# Patient Record
Sex: Male | Born: 1995 | Race: Black or African American | Hispanic: No | Marital: Single | Smoking: Current every day smoker
Health system: Southern US, Community
[De-identification: ages and names within clinical notes are randomized; demographics above are authoritative.]

## PROBLEM LIST (undated history)

## (undated) DIAGNOSIS — J45909 Unspecified asthma, uncomplicated: Secondary | ICD-10-CM

---
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
## 2015-03-13 ENCOUNTER — Encounter (HOSPITAL_COMMUNITY): Payer: Self-pay | Admitting: *Deleted

## 2015-03-13 ENCOUNTER — Emergency Department (HOSPITAL_COMMUNITY)
Admission: EM | Admit: 2015-03-13 | Discharge: 2015-03-13 | Disposition: A | Discharge status: Home / Self Care | Payer: Self-pay | Attending: Emergency Medicine | Admitting: Emergency Medicine

## 2015-03-13 DIAGNOSIS — Z7982 Long term (current) use of aspirin: Secondary | ICD-10-CM | POA: Insufficient documentation

## 2015-03-13 DIAGNOSIS — R200 Anesthesia of skin: Secondary | ICD-10-CM | POA: Insufficient documentation

## 2015-03-13 DIAGNOSIS — J45909 Unspecified asthma, uncomplicated: Secondary | ICD-10-CM | POA: Insufficient documentation

## 2015-03-13 DIAGNOSIS — F17200 Nicotine dependence, unspecified, uncomplicated: Secondary | ICD-10-CM | POA: Insufficient documentation

## 2015-03-13 DIAGNOSIS — M545 Low back pain, unspecified: Secondary | ICD-10-CM

## 2015-03-13 DIAGNOSIS — G8929 Other chronic pain: Secondary | ICD-10-CM | POA: Insufficient documentation

## 2015-03-13 LAB — URINALYSIS, ROUTINE W REFLEX MICROSCOPIC
Bilirubin Urine: NEGATIVE
Glucose, UA: NEGATIVE mg/dL
Hgb urine dipstick: NEGATIVE
KETONES UR: NEGATIVE mg/dL
LEUKOCYTES UA: NEGATIVE
NITRITE: NEGATIVE
Protein, ur: NEGATIVE mg/dL
Specific Gravity, Urine: 1.019 (ref 1.005–1.030)
pH: 8.5 — ABNORMAL HIGH (ref 5.0–8.0)

## 2015-03-13 MED ORDER — METHOCARBAMOL 500 MG PO TABS: 500.0000 mg | ORAL_TABLET | Freq: Two times a day (BID) | ORAL | Status: DC

## 2015-03-13 MED ORDER — NAPROXEN 500 MG PO TABS: 500.0000 mg | ORAL_TABLET | Freq: Two times a day (BID) | ORAL | Status: DC

## 2015-03-13 NOTE — ED Provider Notes (Signed)
CSN: [id number]     Arrival date & time 03/13/15  2223 History  By signing my name below, I, Marisue Humble, attest that this documentation has been prepared under the direction and in the presence of non-physician practitioner, Santiago Glad, PA-C. Electronically Signed: Marisue Humble, Scribe. 11:29 PM.     Chief Complaint  Patient presents with  . Back Pain   The history is provided by the patient. No language interpreter was used.   HPI Comments:  Joseph Rosario is a 20 y.o. male with a PMHx of chronic back pain who presents to the Emergency Department complaining of moderate, intermittent, right lower back pain onset one week ago. Pt reports pain is worse upon waking and when he lays down. He also reports hitting his left foot a few days before onset of back pain. No pain in his left foot at this time.  Pt reports associated numbness/tingling in left leg.  He usually takes Tramadol with mild relief of back pain and notes regular heavy lifting at work. Pt expresses concern for possible kidney stone. Pt denies any recent injury, incontinence, fever, chills, nausea, vomiting, or urinary symptoms.  No Hx of cancer or IV drug use.  Past Medical History  Diagnosis Date  . Asthma    History reviewed. No pertinent past surgical history. No family history on file. Social History  Substance Use Topics  . Smoking status: Current Every Day Smoker -- 0.50 packs/day  . Smokeless tobacco: None  . Alcohol Use: Yes     Comment: occasional    Review of Systems  Constitutional: Negative for fever and chills.  Gastrointestinal: Negative for nausea and vomiting.  Genitourinary: Negative for enuresis.  Musculoskeletal: Positive for back pain.  Neurological: Positive for numbness.  All other systems reviewed and are negative.   Allergies  Review of patient's allergies indicates no known allergies.  Home Medications   Prior to Admission medications   Medication Sig Start Date End Date  Taking? Authorizing Provider  aspirin 325 MG tablet Take 650 mg by mouth daily as needed for headache.   Yes Historical Provider, MD  ibuprofen (ADVIL,MOTRIN) 200 MG tablet Take 400 mg by mouth every 6 (six) hours as needed for moderate pain.   Yes Historical Provider, MD  Pseudoeph-Doxylamine-DM-APAP (NYQUIL PO) Take 1 capsule by mouth at bedtime as needed (cold symptoms).   Yes Historical Provider, MD  traMADol (ULTRAM) 50 MG tablet Take 50 mg by mouth every 6 (six) hours as needed.   Yes Historical Provider, MD   BP 153/92 mmHg  Pulse 74  Temp(Src) 98.6 F (37 C) (Oral)  Resp 18  Wt 125 lb 5 oz (56.841 kg)  SpO2 100% Physical Exam  Constitutional: He appears well-developed and well-nourished. No distress.  HENT:  Head: Normocephalic and atraumatic.  Eyes: Right eye exhibits no discharge. Left eye exhibits no discharge.  Neck: Normal range of motion. Neck supple.  Cardiovascular: Normal rate, regular rhythm and normal heart sounds.   Pulmonary/Chest: Effort normal and breath sounds normal. No respiratory distress.  Musculoskeletal:  Mild TTP over lumbar spine. No step offs or deformities.  Neurological: He is alert. He has normal strength. No sensory deficit. Coordination normal.  Reflex Scores:      Patellar reflexes are 2+ on the right side and 2+ on the left side. Distal sensation of both feet intact. 5/5 muscle strength in BLE.  Skin: No rash noted. He is not diaphoretic.  Psychiatric: He has a normal mood and affect.  His behavior is normal.  Nursing note and vitals reviewed.  ED Course  Procedures  DIAGNOSTIC STUDIES: Oxygen Saturation is 100% on RA, normal by my interpretation.    COORDINATION OF CARE: 11:24 PM Will prescribe a muscle relaxer. Discussed treatment plan with pt at bedside and pt agreed to plan.  Labs Review Labs Reviewed  URINALYSIS, ROUTINE W REFLEX MICROSCOPIC (NOT AT [facility]) - Abnormal; Notable for the following:    APPearance CLOUDY (*)    pH 8.5  (*)    All other components within normal limits   Imaging Review No results found. I have personally reviewed and evaluated these lab results as part of my medical decision-making.   EKG Interpretation None      MDM   Final diagnoses:  Right-sided low back pain without sciatica  Patient with back pain.  No neurological deficits and normal neuro exam.  Patient ambulating without difficulty.  No loss of bowel or bladder control.  No concern for cauda equina.  No fever, night sweats, weight loss, h/o cancer, IVDU.  UA ordered in Triage, negative for hemoglobin or infection.  Patient stable for discharge.  Return precautions given.  I personally performed the services described in this documentation, which was scribed in my presence. The recorded information has been reviewed and is accurate.    Santiago Glad, PA-C 03/14/15 0102  Gilda Crease, MD 03/14/15 [phone]

## 2015-03-13 NOTE — Discharge Instructions (Signed)
Do not drive or operate heavy machinery for 4 hours after taking muscle relaxer.

## 2015-03-13 NOTE — ED Notes (Signed)
Pt c/o back pain x 1.5 years. States his pain comes and goes. States when he takes tramadol it helps a little. Also has tried a muscle relaxer which makes him sleep. Pt wondering if he has kidney stone.

## 2015-03-13 NOTE — ED Notes (Signed)
See PA note for secondary assessment.   

## 2015-03-13 NOTE — ED Notes (Signed)
Pt states pain is in his lower back, radiates to the right.
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
## 2017-06-29 ENCOUNTER — Other Ambulatory Visit: Payer: Self-pay

## 2017-06-29 ENCOUNTER — Emergency Department (HOSPITAL_COMMUNITY)
Admission: EM | Admit: 2017-06-29 | Discharge: 2017-06-29 | Disposition: A | Discharge status: Home / Self Care | Payer: Self-pay | Attending: Emergency Medicine | Admitting: Emergency Medicine

## 2017-06-29 ENCOUNTER — Encounter (HOSPITAL_COMMUNITY): Payer: Self-pay | Admitting: Emergency Medicine

## 2017-06-29 DIAGNOSIS — J45909 Unspecified asthma, uncomplicated: Secondary | ICD-10-CM | POA: Insufficient documentation

## 2017-06-29 DIAGNOSIS — F1210 Cannabis abuse, uncomplicated: Secondary | ICD-10-CM | POA: Insufficient documentation

## 2017-06-29 DIAGNOSIS — F17210 Nicotine dependence, cigarettes, uncomplicated: Secondary | ICD-10-CM | POA: Insufficient documentation

## 2017-06-29 DIAGNOSIS — N481 Balanitis: Secondary | ICD-10-CM | POA: Insufficient documentation

## 2017-06-29 MED ORDER — FLUCONAZOLE 100 MG PO TABS: 100.0000 mg | ORAL_TABLET | Freq: Every day | ORAL | 0 refills | Status: AC

## 2017-06-29 MED ORDER — CLOTRIMAZOLE 1 % EX CREA: TOPICAL_CREAM | CUTANEOUS | 0 refills | Status: DC

## 2017-06-29 MED ORDER — FLUCONAZOLE 100 MG PO TABS: 100.0000 mg | ORAL_TABLET | Freq: Every day | ORAL | Status: DC

## 2017-06-29 NOTE — Discharge Instructions (Signed)
Please apply the topical Clotrimazole cream twice a day.  Avoid sex or masturbation for the next 2 weeks, Diflucan once a day for 5 days  We will call you if your urine test was positive for an STD.

## 2017-06-29 NOTE — ED Triage Notes (Signed)
Pt reports penile pain and bleeding due to rough intercourse x 3 weeks. Bleeding began yesterday. Denies any itching or discharge. States he is uncircumcised.

## 2017-06-29 NOTE — ED Provider Notes (Signed)
[facility] EMERGENCY DEPARTMENT Provider Note   CSN: [id number] Arrival date & time: 06/29/17  0848     History   Chief Complaint Chief Complaint  Patient presents with  . Penis Injury    HPI Joseph Rosario is a 22 y.o. male.  HPI  The patient is a 22 year old male, denies a history of diabetes, states that for the last 2 weeks he has had some pain and discomfort around the head of the penis at the base of the foreskin.  He is able to retract without difficulty.  He has had no difficulty with urination, no burning but has had a small amount of discharge buildup underneath the foreskin.  He reports that the skin underneath there is red, but he does not have any discharge from the penis and denies any risk for STDs.  He is using a condom for protection however he states that the pain is increased during sex.  He denies any current abdominal pain and has had no fevers or chills nausea or vomiting.  Past Medical History:  Diagnosis Date  . Asthma     There are no active problems to display for this patient.   History reviewed. No pertinent surgical history.      Home Medications    Prior to Admission medications   Medication Sig Start Date End Date Taking? Authorizing Provider  aspirin 325 MG tablet Take 650 mg by mouth daily as needed for headache.    [provider]  clotrimazole (LOTRIMIN) 1 % cream Apply to affected area 2 times daily 06/29/17   Eber Hong, MD  ibuprofen (ADVIL,MOTRIN) 200 MG tablet Take 400 mg by mouth every 6 (six) hours as needed for moderate pain.    [provider]  methocarbamol (ROBAXIN) 500 MG tablet Take 1 tablet (500 mg total) by mouth 2 (two) times daily. 03/13/15   Santiago Glad, PA-C  naproxen (NAPROSYN) 500 MG tablet Take 1 tablet (500 mg total) by mouth 2 (two) times daily. 03/13/15   Santiago Glad, PA-C  Pseudoeph-Doxylamine-DM-APAP (NYQUIL PO) Take 1 capsule by mouth at bedtime as needed (cold symptoms).    [provider]  traMADol (ULTRAM) 50 MG tablet Take 50 mg by mouth every 6 (six) hours as needed.    [provider]    Family History No family history on file.  Social History Social History   Tobacco Use  . Smoking status: Current Every Day Smoker    Packs/day: 1.00  . Smokeless tobacco: Never Used  Substance Use Topics  . Alcohol use: Yes    Comment: occasional  . Drug use: Yes    Types: Marijuana    Comment: last use 2-3 days ago     Allergies   Patient has no known allergies.   Review of Systems Review of Systems  Constitutional: Negative for fever.  Gastrointestinal: Negative for abdominal pain and vomiting.  Genitourinary: Positive for genital sores and penile pain. Negative for difficulty urinating, discharge, dysuria, hematuria, penile swelling and scrotal swelling.     Physical Exam Updated Vital Signs BP (!) 150/100 (BP Location: Left Arm)   Pulse (!) 102   Temp 98.2 F (36.8 C) (Oral)   Resp 16   Ht  (1.651 m)   Wt 68.9 kg (152 lb)   SpO2 100%   BMI 25.29 kg/m   Physical Exam  Constitutional: He appears well-developed and well-nourished.  HENT:  Head: Normocephalic and atraumatic.  Eyes: Conjunctivae are normal. Right  eye exhibits no discharge. Left eye exhibits no discharge.  Pulmonary/Chest: Effort normal. No respiratory distress.  Genitourinary:  Genitourinary Comments: Normal-appearing penis scrotum and testicles with a normal retractable foreskin, there is some erythema at the base of the foreskin against the head of the penis.  There is no discharge from the urethral meatus.  There is no open sores wounds or other rashes  Neurological: He is alert. Coordination normal.  Skin: Skin is warm and dry. No rash noted. He is not diaphoretic. No erythema.  Psychiatric: He has a normal mood and affect.  Nursing note and vitals reviewed.    ED Treatments / Results  Labs (all labs ordered are listed, but only abnormal results are  displayed) Labs Reviewed  GC/CHLAMYDIA PROBE AMP (Jessie) NOT AT [facility]    EKG None  Radiology No results found.  Procedures Procedures (including critical care time)  Medications Ordered in ED Medications  fluconazole (DIFLUCAN) tablet 100 mg (has no administration in time range)     Initial Impression / Assessment and Plan / ED Course  I have reviewed the triage vital signs and the nursing notes.  Pertinent labs & imaging results that were available during my care of the patient were reviewed by me and considered in my medical decision making (see chart for details).    The patient has been placing Aquaphor on this area because of the discomfort.  He may have a candidal infection in there, will treat with topical ointment as well as fluconazole.  Will test for STDs by urine though this appears to be more of a foreskin retraction issue than it does to be an sexually transmitted disease.  The patient is agreeable to the plan.  Final Clinical Impressions(s) / ED Diagnoses   Final diagnoses:  Balanitis    ED Discharge Orders        Ordered    clotrimazole (LOTRIMIN) 1 % cream     06/29/17 0902       Eber Hong, MD 06/29/17 [phone]

## 2017-07-02 LAB — GC/CHLAMYDIA PROBE AMP (~~LOC~~) NOT AT ARMC
Chlamydia: NEGATIVE
Neisseria Gonorrhea: NEGATIVE

## 2022-03-20 ENCOUNTER — Emergency Department (HOSPITAL_COMMUNITY)
Admission: EM | Admit: 2022-03-20 | Discharge: 2022-03-20 | Disposition: A | Discharge status: Home / Self Care | Payer: Self-pay | Attending: Emergency Medicine | Admitting: Emergency Medicine

## 2022-03-20 ENCOUNTER — Other Ambulatory Visit: Payer: Self-pay

## 2022-03-20 ENCOUNTER — Encounter (HOSPITAL_COMMUNITY): Payer: Self-pay | Admitting: *Deleted

## 2022-03-20 DIAGNOSIS — L0201 Cutaneous abscess of face: Secondary | ICD-10-CM | POA: Insufficient documentation

## 2022-03-20 DIAGNOSIS — L03211 Cellulitis of face: Secondary | ICD-10-CM | POA: Insufficient documentation

## 2022-03-20 MED ORDER — SULFAMETHOXAZOLE-TRIMETHOPRIM 800-160 MG PO TABS: 1.0000 | ORAL_TABLET | Freq: Two times a day (BID) | ORAL | 0 refills | Status: AC

## 2022-03-20 NOTE — ED Triage Notes (Signed)
Pt has swelling and redness to area surrounding right eye.  This began as a "boil" on right eyebrown thursday but swelling increased each day.  Pt has puffyness in his eyelids and surrounding area, vision is not affected.  No fever or chills.

## 2022-03-20 NOTE — ED Provider Notes (Signed)
[facility] Provider Note   CSN: [id number] Arrival date & time: 03/20/22  1526     History  Chief Complaint  Patient presents with   Recurrent Skin Infections    Joseph Rosario is a 27 y.o. male with history of asthma who presents the emergency department complaining of a boil in his face with associated swelling.  This started about 3 days ago.  He reports a history of prior.  He wanted to come be evaluated because he was starting to have some swelling of his right eyelid.  Used warm compress last night and felt the area come to ahead.  Denies any changes to his vision.  No fevers or chills.  HPI     Home Medications Prior to Admission medications   Medication Sig Start Date End Date Taking? Authorizing Provider  sulfamethoxazole-trimethoprim (BACTRIM DS) 800-160 MG tablet Take 1 tablet by mouth 2 (two) times daily for 7 days. 03/20/22 03/27/22 Yes Shadi Larner T, PA-C  aspirin 325 MG tablet Take 650 mg by mouth daily as needed for headache.    [provider]  clotrimazole (LOTRIMIN) 1 % cream Apply to affected area 2 times daily 06/29/17   Noemi Chapel, MD  ibuprofen (ADVIL,MOTRIN) 200 MG tablet Take 400 mg by mouth every 6 (six) hours as needed for moderate pain.    [provider]  methocarbamol (ROBAXIN) 500 MG tablet Take 1 tablet (500 mg total) by mouth 2 (two) times daily. 03/13/15   Hyman Bible, PA-C  naproxen (NAPROSYN) 500 MG tablet Take 1 tablet (500 mg total) by mouth 2 (two) times daily. 03/13/15   Hyman Bible, PA-C  Pseudoeph-Doxylamine-DM-APAP (NYQUIL PO) Take 1 capsule by mouth at bedtime as needed (cold symptoms).    [provider]  traMADol (ULTRAM) 50 MG tablet Take 50 mg by mouth every 6 (six) hours as needed.    [provider]      Allergies    Patient has no known allergies.    Review of Systems   Review of Systems  HENT:  Positive for facial swelling.   Skin:         abscess  All other systems reviewed and are negative.   Physical Exam Updated Vital Signs There were no vitals taken for this visit. Physical Exam Vitals and nursing note reviewed.  Constitutional:      Appearance: Normal appearance.  HENT:     Head: Normocephalic and atraumatic.      Comments: Area of erythema and fluctuance, about 1.5 cm in diameter. Mild surrounding cellulitis Eyes:     Extraocular Movements: Extraocular movements intact.     Conjunctiva/sclera: Conjunctivae normal.     Comments: Right upper and lower eyelid mildly swollen. No tenderness or crepitus palpated.   Pulmonary:     Effort: Pulmonary effort is normal. No respiratory distress.  Skin:    General: Skin is warm and dry.  Neurological:     Mental Status: He is alert.  Psychiatric:        Mood and Affect: Mood normal.        Behavior: Behavior normal.     ED Results / Procedures / Treatments   Labs (all labs ordered are listed, but only abnormal results are displayed) Labs Reviewed - No data to display  EKG None  Radiology No results found.  Procedures Procedures    Medications Ordered in ED Medications - No data to display  ED Course/ Medical  Decision Making/ A&P                             Medical Decision Making Risk Prescription drug management.  This patient is a 27 y.o. male who presents to the ED for concern of facial abscess and swelling.   Differential diagnoses prior to evaluation: Abscess, cellulitis, orbital cellulitis, preseptal cellulitis  Past Medical History / Social History / Additional history: Chart reviewed. Pertinent results include: asthma  Physical Exam: Physical exam performed. The pertinent findings include: abscess about 1.5 cm in diameter just lateral of the right eyebrow with mild surrounding cellulitis, some swelling of the right eyelids without affecting vision  Medications / Treatment: Abscess is very superficial and appears to be ready to  open on its own, thus I do not feel it is requiring incision and drainage today   Disposition: After consideration of the diagnostic results and the patients response to treatment, I feel that emergency department workup does not suggest an emergent condition requiring admission or immediate intervention beyond what has been performed at this time. The plan is: discharge to home with antibiotics and recommendations for warm compresses and gentle pressure to assist in opening the abscess. Not needing I&D today. Low concern for orbital cellulitis and patient has no change in vision and no pain with EOMs. The patient is safe for discharge and has been instructed to return immediately for worsening symptoms, change in symptoms or any other concerns.  Final Clinical Impression(s) / ED Diagnoses Final diagnoses:  Facial cellulitis  Facial abscess    Rx / DC Orders ED Discharge Orders          Ordered    sulfamethoxazole-trimethoprim (BACTRIM DS) 800-160 MG tablet  2 times daily        03/20/22 1710           Portions of this report may have been transcribed using voice recognition software. Every effort was made to ensure accuracy; however, inadvertent computerized transcription errors may be present.    Estill Cotta 03/20/22 1716    Noemi Chapel, MD 03/21/22 [phone]

## 2022-03-20 NOTE — Discharge Instructions (Signed)
You are seen in the ER for skin infection.  As we discussed the boil next your eyebrow is called an abscess.  I think that this area wants to open up on its own.  I recommend taking ibuprofen and/or Tylenol as needed for pain, and also will help with the swelling.  If you can find any numbing cream or jelly at the pharmacy you can put this over the area for about 10 to 15 minutes.  At that point I would use a warm compress and apply very gentle pressure.  I think that you will find this will open on its own and will require using a needle or scalpel.  I am placing you on antibiotics to help treat the surrounding skin infection as well.  Continue monitor how you are doing and return to the ER for any new or worsening symptoms such as fever, or any changes in your vision.

## 2023-02-22 ENCOUNTER — Encounter (HOSPITAL_COMMUNITY): Payer: Self-pay

## 2023-02-22 ENCOUNTER — Other Ambulatory Visit: Payer: Self-pay

## 2023-02-22 ENCOUNTER — Emergency Department (HOSPITAL_COMMUNITY)
Admission: EM | Admit: 2023-02-22 | Discharge: 2023-02-22 | Disposition: A | Discharge status: Home / Self Care | Payer: Self-pay | Attending: Emergency Medicine | Admitting: Emergency Medicine

## 2023-02-22 DIAGNOSIS — K047 Periapical abscess without sinus: Secondary | ICD-10-CM

## 2023-02-22 DIAGNOSIS — Z7982 Long term (current) use of aspirin: Secondary | ICD-10-CM | POA: Insufficient documentation

## 2023-02-22 MED ORDER — AMOXICILLIN 250 MG PO CAPS
500.0000 mg | ORAL_CAPSULE | Freq: Once | ORAL | Status: AC
  Administered 2023-02-22: 500 mg via ORAL
  Filled 2023-02-22: qty 2

## 2023-02-22 MED ORDER — AMOXICILLIN 500 MG PO CAPS: 500.0000 mg | ORAL_CAPSULE | Freq: Three times a day (TID) | ORAL | 0 refills | Status: DC

## 2023-02-22 NOTE — ED Provider Notes (Signed)
[name] Provider Note   CSN: [id number] Arrival date & time: 02/22/23  1253     History  Chief Complaint  Patient presents with   Dental Pain    Joseph Rosario is a 28 y.o. male presenting with pain and swelling of his gingiva medial to his right upper third molar tooth.  He has had no specific chronic pain from his wisdom teeth but was advised he will probably need to have them removed, developed a "hole" in his gingiva which he first noticed yesterday.  He is unsure if there is been drainage from the site.  He does endorse right sided throat pain described as soreness with swallowing, he has had no fevers or chills, difficulty swallowing or other complaints.  He has used both hydrogen peroxide and salt water swish and spit solutions to the site.  He is found no alleviators for symptoms.  He does have a Education officer, community and Joseph Rosario but has been sometime since he has seen this provider.  The history is provided by the patient.       Home Medications Prior to Admission medications   Medication Sig Start Date End Date Taking? Authorizing Provider  amoxicillin (AMOXIL) 500 MG capsule Take 1 capsule (500 mg total) by mouth 3 (three) times daily. 02/22/23  Yes Jennika Ringgold, Raynelle Fanning, PA-C  aspirin 325 MG tablet Take 650 mg by mouth daily as needed for headache.    [provider]  clotrimazole (LOTRIMIN) 1 % cream Apply to affected area 2 times daily 06/29/17   Eber Hong, MD  ibuprofen (ADVIL,MOTRIN) 200 MG tablet Take 400 mg by mouth every 6 (six) hours as needed for moderate pain.    [provider]  methocarbamol (ROBAXIN) 500 MG tablet Take 1 tablet (500 mg total) by mouth 2 (two) times daily. 03/13/15   Santiago Glad, PA-C  naproxen (NAPROSYN) 500 MG tablet Take 1 tablet (500 mg total) by mouth 2 (two) times daily. 03/13/15   Santiago Glad, PA-C  Pseudoeph-Doxylamine-DM-APAP (NYQUIL PO) Take 1 capsule by mouth at bedtime as needed  (cold symptoms).    [provider]  traMADol (ULTRAM) 50 MG tablet Take 50 mg by mouth every 6 (six) hours as needed.    [provider]      Allergies    Patient has no known allergies.    Review of Systems   Review of Systems  Constitutional:  Negative for chills and fever.  HENT:  Positive for dental problem and sore throat. Negative for facial swelling.   Respiratory:  Negative for shortness of breath.   Musculoskeletal:  Negative for neck pain and neck stiffness.    Physical Exam Updated Vital Signs BP (!) 156/98 (BP Location: Right Arm)   Pulse 73   Temp 99.2 F (37.3 C) (Oral)   Resp 14   Ht 5\' 6"  (1.676 m)   Wt 72.6 kg   SpO2 100%   BMI 25.82 kg/m  Physical Exam Constitutional:      General: He is not in acute distress.    Appearance: He is well-developed.  HENT:     Head: Normocephalic and atraumatic.     Jaw: No trismus.     Right Ear: Tympanic membrane and external ear normal.     Left Ear: Tympanic membrane and external ear normal.     Mouth/Throat:     Mouth: Mucous membranes are moist. No oral lesions.     Dentition: No dental abscesses.  Pharynx: Oropharynx is clear. No oropharyngeal exudate or posterior oropharyngeal erythema.     Comments: There is a small laceration of his gingiva medial to his right upper third molar tooth.  There is tenderness palpation at the site, there is no obvious purulent drainage from the site.  There is no trismus, sublingual space is soft.  No tonsillar adenopathy. Eyes:     Conjunctiva/sclera: Conjunctivae normal.  Cardiovascular:     Rate and Rhythm: Normal rate.  Musculoskeletal:        General: Normal range of motion.     Cervical back: Normal range of motion and neck supple.  Lymphadenopathy:     Cervical: No cervical adenopathy.  Skin:    General: Skin is warm and dry.     Findings: No erythema.  Neurological:     Mental Status: He is alert and oriented to person, place, and time.      ED Results / Procedures / Treatments   Labs (all labs ordered are listed, but only abnormal results are displayed) Labs Reviewed - No data to display  EKG None  Radiology No results found.  Procedures Procedures    Medications Ordered in ED Medications  amoxicillin (AMOXIL) capsule 500 mg (500 mg Oral Given 02/22/23 1642)    ED Course/ Medical Decision Making/ A&P                                 Medical Decision Making Patient with gingival edema and a small punctum next to the right third molar tooth suggesting possible tracking from the root of this tooth.  There is no significant facial edema or erythema, no trismus and no sublingual induration suggesting Ludwig's angina.  Oropharynx is clear.  He is placed on amoxicillin for treatment of dental infection.  Advised close follow-up with his dentist, he will call on Monday to arrange an appointment.  Risk Prescription drug management.           Final Clinical Impression(s) / ED Diagnoses Final diagnoses:  Dental infection  Dental abscess    Rx / DC Orders ED Discharge Orders          Ordered    amoxicillin (AMOXIL) 500 MG capsule  3 times daily        02/22/23 1633              Victoriano Lain 02/22/23 2002    Terrilee Files, MD 02/23/23 1047

## 2023-02-22 NOTE — Discharge Instructions (Signed)
Complete your entire course of antibiotics as prescribed.    Avoid applying heat or ice to this abscess area which can worsen your symptoms.  You may use warm salt water swish and spit treatment or half peroxide and water swish and spit after meals to keep this area clean as discussed.  Call the dentist listed above for further management of your symptoms.  

## 2023-02-22 NOTE — ED Triage Notes (Signed)
Pt stated that he believes he has an abscess behind his right upper wisdom tooth. Pt says he can see a hole and he has pain in his throat and back teeth on the right side.

## 2023-04-03 ENCOUNTER — Telehealth: Payer: Self-pay | Admitting: Physician Assistant

## 2023-04-03 DIAGNOSIS — J020 Streptococcal pharyngitis: Secondary | ICD-10-CM

## 2023-04-03 MED ORDER — AMOXICILLIN 875 MG PO TABS: 875.0000 mg | ORAL_TABLET | Freq: Two times a day (BID) | ORAL | 0 refills | Status: AC

## 2023-04-03 NOTE — Progress Notes (Signed)
 Virtual Visit Consent   [name], you are scheduled for a virtual visit with a [facility] provider today. Just as with appointments in the office, your consent must be obtained to participate. Your consent will be active for this visit and any virtual visit you may have with one of our providers in the next 365 days. If you have a MyChart account, a copy of this consent can be sent to you electronically.  As this is a virtual visit, video technology does not allow for your provider to perform a traditional examination. This may limit your provider's ability to fully assess your condition. If your provider identifies any concerns that need to be evaluated in person or the need to arrange testing (such as labs, EKG, etc.), we will make arrangements to do so. Although advances in technology are sophisticated, we cannot ensure that it will always work on either your end or our end. If the connection with a video visit is poor, the visit may have to be switched to a telephone visit. With either a video or telephone visit, we are not always able to ensure that we have a secure connection.  By engaging in this virtual visit, you consent to the provision of healthcare and authorize for your insurance to be billed (if applicable) for the services provided during this visit. Depending on your insurance coverage, you may receive a charge related to this service.  I need to obtain your verbal consent now. Are you willing to proceed with your visit today? TRAXTON KOLENDA has provided verbal consent on 04/03/2023 for a virtual visit (video or telephone). Margaretann Loveless, PA-C  Date: 04/03/2023 2:37 PM   Virtual Visit via Video Note   I, Margaretann Loveless, connected with  Joseph Rosario  ([id number], Sep 12, 1995) on 04/03/23 at  2:30 PM EST by a video-enabled telemedicine application and verified that I am speaking with the correct person using two identifiers.  Location: Patient: Virtual Visit Location  Patient: Home Provider: Virtual Visit Location Provider: Home Office   I discussed the limitations of evaluation and management by telemedicine and the availability of in person appointments. The patient expressed understanding and agreed to proceed.    History of Present Illness: Joseph Rosario is a 28 y.o. who identifies as a male who was assigned male at birth, and is being seen today for sore throat, congestion, chest pain.  HPI: Sore Throat  This is a new problem. The current episode started in the past 7 days. The problem has been gradually worsening. There has been no fever (feels hot). The pain is mild. Associated symptoms include congestion, coughing, diarrhea, headaches, swollen glands and trouble swallowing. Pertinent negatives include no ear discharge, ear pain, hoarse voice, plugged ear sensation, shortness of breath or vomiting. He has had exposure to strep. Exposure to: son. He has tried acetaminophen (warm tea with honey and lemon) for the symptoms. The treatment provided no relief.    Had intermittent chest pain Sunday and Monday noted on the right side and most noticeable with bending forward, now resolved completely.  Problems: There are no active problems to display for this patient.   Allergies: No Known Allergies Medications:  Current Outpatient Medications:    amoxicillin (AMOXIL) 875 MG tablet, Take 1 tablet (875 mg total) by mouth 2 (two) times daily for 10 days., Disp: 20 tablet, Rfl: 0   aspirin 325 MG tablet, Take 650 mg by mouth daily as needed for headache., Disp: ,  Rfl:    clotrimazole (LOTRIMIN) 1 % cream, Apply to affected area 2 times daily, Disp: 15 g, Rfl: 0   ibuprofen (ADVIL,MOTRIN) 200 MG tablet, Take 400 mg by mouth every 6 (six) hours as needed for moderate pain., Disp: , Rfl:    methocarbamol (ROBAXIN) 500 MG tablet, Take 1 tablet (500 mg total) by mouth 2 (two) times daily., Disp: 20 tablet, Rfl: 0   naproxen (NAPROSYN) 500 MG tablet, Take 1 tablet  (500 mg total) by mouth 2 (two) times daily., Disp: 30 tablet, Rfl: 0   Pseudoeph-Doxylamine-DM-APAP (NYQUIL PO), Take 1 capsule by mouth at bedtime as needed (cold symptoms)., Disp: , Rfl:    traMADol (ULTRAM) 50 MG tablet, Take 50 mg by mouth every 6 (six) hours as needed., Disp: , Rfl:   Observations/Objective: Patient is well-developed, well-nourished in no acute distress.  Resting comfortably at home.  Head is normocephalic, atraumatic.  No labored breathing.  Speech is clear and coherent with logical content.  Patient is alert and oriented at baseline.    Assessment and Plan: 1. Strep throat (Primary) - amoxicillin (AMOXIL) 875 MG tablet; Take 1 tablet (875 mg total) by mouth 2 (two) times daily for 10 days.  Dispense: 20 tablet; Refill: 0  - Suspect strep throat - Amoxicillin prescribed - Tylenol and Ibuprofen alternating every 4 hours - Salt water gargles - Chloraseptic spray - Liquid and soft food diet - Push fluids - New toothbrush in 3 days - Seek in person evaluation if not improving or if symptoms worsen   Follow Up Instructions: I discussed the assessment and treatment plan with the patient. The patient was provided an opportunity to ask questions and all were answered. The patient agreed with the plan and demonstrated an understanding of the instructions.  A copy of instructions were sent to the patient via MyChart unless otherwise noted below.    The patient was advised to call back or seek an in-person evaluation if the symptoms worsen or if the condition fails to improve as anticipated.    Margaretann Loveless, PA-C

## 2023-04-03 NOTE — Patient Instructions (Signed)
 Joseph Rosario, thank you for joining Margaretann Loveless, PA-C for today's virtual visit.  While this provider is not your primary care provider (PCP), if your PCP is located in our provider database this encounter information will be shared with them immediately following your visit.   A [facility] MyChart account gives you access to today's visit and all your visits, tests, and labs performed at [facility] " click here if you don't have a [facility] MyChart account or go to [email or web address]  Consent: (Patient) Joseph Rosario provided verbal consent for this virtual visit at the beginning of the encounter.  Current Medications:  Current Outpatient Medications:    amoxicillin (AMOXIL) 875 MG tablet, Take 1 tablet (875 mg total) by mouth 2 (two) times daily for 10 days., Disp: 20 tablet, Rfl: 0   aspirin 325 MG tablet, Take 650 mg by mouth daily as needed for headache., Disp: , Rfl:    clotrimazole (LOTRIMIN) 1 % cream, Apply to affected area 2 times daily, Disp: 15 g, Rfl: 0   ibuprofen (ADVIL,MOTRIN) 200 MG tablet, Take 400 mg by mouth every 6 (six) hours as needed for moderate pain., Disp: , Rfl:    methocarbamol (ROBAXIN) 500 MG tablet, Take 1 tablet (500 mg total) by mouth 2 (two) times daily., Disp: 20 tablet, Rfl: 0   naproxen (NAPROSYN) 500 MG tablet, Take 1 tablet (500 mg total) by mouth 2 (two) times daily., Disp: 30 tablet, Rfl: 0   Pseudoeph-Doxylamine-DM-APAP (NYQUIL PO), Take 1 capsule by mouth at bedtime as needed (cold symptoms)., Disp: , Rfl:    traMADol (ULTRAM) 50 MG tablet, Take 50 mg by mouth every 6 (six) hours as needed., Disp: , Rfl:    Medications ordered in this encounter:  Meds ordered this encounter  Medications   amoxicillin (AMOXIL) 875 MG tablet    Sig: Take 1 tablet (875 mg total) by mouth 2 (two) times daily for 10 days.    Dispense:  20 tablet    Refill:  0    Supervising Provider:   Merrilee Jansky [[id number]]     *If you need  refills on other medications prior to your next appointment, please contact your pharmacy*  Follow-Up: Call back or seek an in-person evaluation if the symptoms worsen or if the condition fails to improve as anticipated.  [facility] [phone]  Other Instructions Strep Throat, Adult Strep throat is an infection in the throat that is caused by bacteria. It is common during the cold months of the year. It mostly affects children who are 64-1 years old. However, people of all ages can get it at any time of the year. This infection spreads from person to person (is contagious) through coughing, sneezing, or having close contact. Your health care provider may use other names to describe the infection. When strep throat affects the tonsils, it is called tonsillitis. When it affects the back of the throat, it is called pharyngitis. What are the causes? This condition is caused by the Streptococcus pyogenes bacteria. What increases the risk? You are more likely to develop this condition if: You care for school-age children, or are around school-age children. Children are more likely to get strep throat and may spread it to others. You spend time in crowded places where the infection can spread easily. You have close contact with someone who has strep throat. What are the signs or symptoms? Symptoms of this condition include: Fever or chills. Redness, swelling, or  pain in the tonsils or throat. Pain or difficulty when swallowing. White or yellow spots on the tonsils or throat. Tender glands in the neck and under the jaw. Bad smelling breath. Red rash all over the body. This is rare. How is this diagnosed? This condition is diagnosed by tests that check for the presence and the amount of bacteria that cause strep throat. They are: Rapid strep test. Your throat is swabbed and checked for the presence of bacteria. Results are usually ready in minutes. Throat culture test. Your  throat is swabbed. The sample is placed in a cup that allows infections to grow. Results are usually ready in 1 or 2 days. How is this treated? This condition may be treated with: Medicines that kill germs (antibiotics). Medicines that relieve pain or fever. These include: Ibuprofen or acetaminophen. Aspirin, only for people who are over the age of 11. Throat lozenges. Throat sprays. Follow these instructions at home: Medicines  Take over-the-counter and prescription medicines only as told by your health care provider. Take your antibiotic medicine as told by your health care provider. Do not stop taking the antibiotic even if you start to feel better. Eating and drinking  If you have trouble swallowing, try eating soft foods until your sore throat feels better. Drink enough fluid to keep your urine pale yellow. To help relieve pain, you may have: Warm fluids, such as soup and tea. Cold fluids, such as frozen desserts or popsicles. General instructions Gargle with a salt-water mixture 3-4 times a day or as needed. To make a salt-water mixture, completely dissolve -1 tsp (3-6 g) of salt in 1 cup (237 mL) of warm water. Get plenty of rest. Stay home from work or school until you have been taking antibiotics for 24 hours. Do not use any products that contain nicotine or tobacco. These products include cigarettes, chewing tobacco, and vaping devices, such as e-cigarettes. If you need help quitting, ask your health care provider. It is up to you to get your test results. Ask your health care provider, or the department that is doing the test, when your results will be ready. Keep all follow-up visits. This is important. How is this prevented?  Do not share food, drinking cups, or personal items that could cause the infection to spread to other people. Wash your hands often with soap and water for at least 20 seconds. If soap and water are not available, use hand sanitizer. Make sure that  all people in your house wash their hands well. Have family members tested if they have a sore throat or fever. They may need an antibiotic if they have strep throat. Contact a health care provider if: You have swelling in your neck that keeps getting bigger. You develop a rash, cough, or earache. You cough up a thick mucus that is green, yellow-brown, or bloody. You have pain or discomfort that does not get better with medicine. Your symptoms seem to be getting worse. You have a fever. Get help right away if: You have new symptoms, such as vomiting, severe headache, stiff or painful neck, chest pain, or shortness of breath. You have severe throat pain, drooling, or changes in your voice. You have swelling of the neck, or the skin on the neck becomes red and tender. You have signs of dehydration, such as tiredness (fatigue), dry mouth, and decreased urination. You become increasingly sleepy, or you cannot wake up completely. Your joints become red or painful. These symptoms may represent a serious  problem that is an emergency. Do not wait to see if the symptoms will go away. Get medical help right away. Call your local emergency services (911 in the [place] Do not drive yourself to the hospital. Summary Strep throat is an infection in the throat that is caused by the Streptococcus pyogenes bacteria. This infection is spread from person to person (is contagious) through coughing, sneezing, or having close contact. Take your medicines, including antibiotics, as told by your health care provider. Do not stop taking the antibiotic even if you start to feel better. To prevent the spread of germs, wash your hands well with soap and water. Have others do the same. Do not share food, drinking cups, or personal items. Get help right away if you have new symptoms, such as vomiting, severe headache, stiff or painful neck, chest pain, or shortness of breath. This information is not intended to replace  advice given to you by your health care provider. Make sure you discuss any questions you have with your health care provider. Document Revised: 05/17/2020 Document Reviewed: 05/17/2020 Elsevier Patient Education  2024 Elsevier Inc.   If you have been instructed to have an in-person evaluation today at a local Urgent Care facility, please use the link below. It will take you to a list of all of our available [facility], including address, phone number and hours of operation. Please do not delay care.  [facility]  If you or a family member do not have a primary care provider, use the link below to schedule a visit and establish care. When you choose a [facility] primary care physician or advanced practice provider, you gain a long-term partner in health. Find a Primary Care Provider  Learn more about [facility]'s in-office and virtual care options: [facility] - Get Care Now

## 2023-05-13 ENCOUNTER — Emergency Department (HOSPITAL_COMMUNITY): Payer: Self-pay

## 2023-05-13 ENCOUNTER — Other Ambulatory Visit: Payer: Self-pay

## 2023-05-13 ENCOUNTER — Encounter (HOSPITAL_COMMUNITY): Payer: Self-pay

## 2023-05-13 ENCOUNTER — Emergency Department (HOSPITAL_COMMUNITY)
Admission: EM | Admit: 2023-05-13 | Discharge: 2023-05-13 | Disposition: A | Discharge status: Home / Self Care | Payer: Self-pay | Attending: Emergency Medicine | Admitting: Emergency Medicine

## 2023-05-13 DIAGNOSIS — I10 Essential (primary) hypertension: Secondary | ICD-10-CM

## 2023-05-13 DIAGNOSIS — Y92410 Unspecified street and highway as the place of occurrence of the external cause: Secondary | ICD-10-CM | POA: Insufficient documentation

## 2023-05-13 DIAGNOSIS — M549 Dorsalgia, unspecified: Secondary | ICD-10-CM | POA: Insufficient documentation

## 2023-05-13 DIAGNOSIS — R1012 Left upper quadrant pain: Secondary | ICD-10-CM | POA: Insufficient documentation

## 2023-05-13 DIAGNOSIS — S39012A Strain of muscle, fascia and tendon of lower back, initial encounter: Secondary | ICD-10-CM

## 2023-05-13 LAB — COMPREHENSIVE METABOLIC PANEL WITH GFR
ALT: 50 U/L — ABNORMAL HIGH (ref 0–44)
AST: 30 U/L (ref 15–41)
Albumin: 4.3 g/dL (ref 3.5–5.0)
Alkaline Phosphatase: 70 U/L (ref 38–126)
Anion gap: 13 (ref 5–15)
BUN: 8 mg/dL (ref 6–20)
CO2: 21 mmol/L — ABNORMAL LOW (ref 22–32)
Calcium: 9.4 mg/dL (ref 8.9–10.3)
Chloride: 103 mmol/L (ref 98–111)
Creatinine, Ser: 0.84 mg/dL (ref 0.61–1.24)
GFR, Estimated: >60 mL/min (ref >60)
Glucose, Bld: 107 mg/dL — ABNORMAL HIGH (ref 70–99)
Potassium: 3.7 mmol/L (ref 3.5–5.1)
Sodium: 137 mmol/L (ref 135–145)
Total Bilirubin: 1.2 mg/dL (ref 0.0–1.2)
Total Protein: 8.4 g/dL — ABNORMAL HIGH (ref 6.5–8.1)

## 2023-05-13 LAB — CBC WITH DIFFERENTIAL/PLATELET
Abs Immature Granulocytes: 0.01 10*3/uL (ref 0.00–0.07)
Basophils Absolute: 0 10*3/uL (ref 0.0–0.1)
Basophils Relative: 0 %
Eosinophils Absolute: 0 10*3/uL (ref 0.0–0.5)
Eosinophils Relative: 0 %
HCT: 47.4 % (ref 39.0–52.0)
Hemoglobin: 16.5 g/dL (ref 13.0–17.0)
Immature Granulocytes: 0 %
Lymphocytes Relative: 15 %
Lymphs Abs: 0.9 10*3/uL (ref 0.7–4.0)
MCH: 29.6 pg (ref 26.0–34.0)
MCHC: 34.8 g/dL (ref 30.0–36.0)
MCV: 85.1 fL (ref 80.0–100.0)
Monocytes Absolute: 0.4 10*3/uL (ref 0.1–1.0)
Monocytes Relative: 6 %
Neutro Abs: 4.5 10*3/uL (ref 1.7–7.7)
Neutrophils Relative %: 79 %
Platelets: 241 10*3/uL (ref 150–400)
RBC: 5.57 MIL/uL (ref 4.22–5.81)
RDW: 14 % (ref 11.5–15.5)
WBC: 5.8 10*3/uL (ref 4.0–10.5)
nRBC: 0 % (ref 0.0–0.2)

## 2023-05-13 LAB — TROPONIN I (HIGH SENSITIVITY): Troponin I (High Sensitivity): <2 ng/L (ref <18)

## 2023-05-13 LAB — LIPASE, BLOOD: Lipase: 36 U/L (ref 11–51)

## 2023-05-13 MED ORDER — HYDROMORPHONE HCL 1 MG/ML IJ SOLN
0.5000 mg | Freq: Once | INTRAMUSCULAR | Status: AC
  Administered 2023-05-13: 0.5 mg via INTRAVENOUS
  Filled 2023-05-13: qty 0.5

## 2023-05-13 MED ORDER — HYDROCHLOROTHIAZIDE 12.5 MG PO TABS
12.5000 mg | ORAL_TABLET | Freq: Every day | ORAL | Status: DC
Start: 2023-05-13 — End: 2023-05-14

## 2023-05-13 MED ORDER — MORPHINE SULFATE (PF) 4 MG/ML IV SOLN: 4.0000 mg | Freq: Once | INTRAVENOUS | Status: DC

## 2023-05-13 MED ORDER — MELOXICAM 7.5 MG PO TABS: 7.5000 mg | ORAL_TABLET | Freq: Every day | ORAL | 0 refills | Status: DC

## 2023-05-13 MED ORDER — IOHEXOL 300 MG/ML  SOLN
100.0000 mL | Freq: Once | INTRAMUSCULAR | Status: AC | PRN
  Administered 2023-05-13: 100 mL via INTRAVENOUS

## 2023-05-13 MED ORDER — MORPHINE SULFATE (PF) 4 MG/ML IV SOLN
4.0000 mg | Freq: Once | INTRAVENOUS | Status: AC
Start: 2023-05-13 — End: 2023-05-13
  Administered 2023-05-13: 4 mg via INTRAVENOUS
  Filled 2023-05-13: qty 1

## 2023-05-13 MED ORDER — ONDANSETRON HCL 4 MG/2ML IJ SOLN
4.0000 mg | Freq: Once | INTRAMUSCULAR | Status: AC
Start: 2023-05-13 — End: 2023-05-13
  Administered 2023-05-13: 4 mg via INTRAVENOUS
  Filled 2023-05-13: qty 2

## 2023-05-13 MED ORDER — METHOCARBAMOL 500 MG PO TABS: 500.0000 mg | ORAL_TABLET | Freq: Four times a day (QID) | ORAL | 0 refills | Status: AC | PRN

## 2023-05-13 MED ORDER — HYDROCHLOROTHIAZIDE 12.5 MG PO TABS: 12.5000 mg | ORAL_TABLET | Freq: Every day | ORAL | 3 refills | Status: DC
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
## 2023-05-13 NOTE — ED Provider Notes (Signed)
  Physical Exam  BP (!) 162/111   Pulse 73   Temp 98.1 F (36.7 C)   Resp (!) 21   Ht 5\' 6"  (1.676 m)   Wt 74.8 kg   SpO2 97%   BMI 26.63 kg/m   Physical Exam  Procedures  Procedures  ED Course / MDM    Medical Decision Making Amount and/or Complexity of Data Reviewed Labs: ordered. Radiology: ordered.  Risk Prescription drug management.  This patient's care was assumed by me at shift change. The tests pending are CT chest abd pelvis, MRI lspine and t spine.  Results are normal no traumatic finding.    Patient was re-evaluated by me as well. I discussed their result with them and plan is for discharge home with NSAIDs.   Also discussed with patient that his blood pressure been very high while in the ER.  He states several years ago he was told he should start blood pressure medicine but he wanted to "work on it himself".  He is agreeable with starting HCTZ 12.5 daily and following up with PCP.  He requested work note which was given as well.  He was given strict return precautions.       Josem Kaufmann 05/13/23 2200    Rondel Baton, MD 05/16/23 [phone]

## 2023-05-13 NOTE — ED Notes (Signed)
 Pt stated. "My back still is hurting but everything else feels a little better."

## 2023-05-13 NOTE — ED Notes (Signed)
 Patient transported to CT

## 2023-05-16 ENCOUNTER — Telehealth: Payer: Self-pay | Admitting: Physician Assistant

## 2023-05-16 DIAGNOSIS — J039 Acute tonsillitis, unspecified: Secondary | ICD-10-CM

## 2023-05-16 DIAGNOSIS — M545 Low back pain, unspecified: Secondary | ICD-10-CM

## 2023-05-16 MED ORDER — MELOXICAM 15 MG PO TABS
15.0000 mg | ORAL_TABLET | Freq: Every day | ORAL | 0 refills | Status: AC
Start: 2023-05-16 — End: ?

## 2023-05-16 MED ORDER — AZITHROMYCIN 250 MG PO TABS: ORAL_TABLET | ORAL | 0 refills | Status: AC

## 2023-05-16 NOTE — Patient Instructions (Signed)
 Joseph Rosario, thank you for joining Piedad Climes, PA-C for today's virtual visit.  While this provider is not your primary care provider (PCP), if your PCP is located in our provider database this encounter information will be shared with them immediately following your visit.   A [facility] MyChart account gives you access to today's visit and all your visits, tests, and labs performed at [facility] " click here if you don't have a [facility] MyChart account or go to [email or web address]  Consent: (Patient) Joseph Rosario provided verbal consent for this virtual visit at the beginning of the encounter.  Current Medications:  Current Outpatient Medications:    aspirin 325 MG tablet, Take 650 mg by mouth daily as needed for headache., Disp: , Rfl:    clotrimazole (LOTRIMIN) 1 % cream, Apply to affected area 2 times daily, Disp: 15 g, Rfl: 0   hydrochlorothiazide (HYDRODIURIL) 12.5 MG tablet, Take 1 tablet (12.5 mg total) by mouth daily., Disp: 30 tablet, Rfl: 3   ibuprofen (ADVIL,MOTRIN) 200 MG tablet, Take 400 mg by mouth every 6 (six) hours as needed for moderate pain., Disp: , Rfl:    meloxicam (MOBIC) 7.5 MG tablet, Take 1 tablet (7.5 mg total) by mouth daily., Disp: 5 tablet, Rfl: 0   methocarbamol (ROBAXIN) 500 MG tablet, Take 1 tablet (500 mg total) by mouth every 6 (six) hours as needed for muscle spasms., Disp: 20 tablet, Rfl: 0   naproxen (NAPROSYN) 500 MG tablet, Take 1 tablet (500 mg total) by mouth 2 (two) times daily., Disp: 30 tablet, Rfl: 0   Pseudoeph-Doxylamine-DM-APAP (NYQUIL PO), Take 1 capsule by mouth at bedtime as needed (cold symptoms)., Disp: , Rfl:    traMADol (ULTRAM) 50 MG tablet, Take 50 mg by mouth every 6 (six) hours as needed., Disp: , Rfl:    Medications ordered in this encounter:  No orders of the defined types were placed in this encounter.    *If you need refills on other medications prior to your next appointment, please contact  your pharmacy*  Follow-Up: Call back or seek an in-person evaluation if the symptoms worsen or if the condition fails to improve as anticipated.  [facility] [phone]  Other Instructions Please continue to rest and avoid heavy lifting or overexertion.  Apply heat to the lower back for 10-15 minutes, a few times per day. I have sent in a new dose of the Meloxicam to take once daily with food.  You can use Tylenol OTC as well. Continue the muscle relaxant as directed, but no driving while taking.  If not continuing to improve/resolve or any new or worsening symptoms, you need re-evaluation in person.   For the throat, please take the antibiotic as directed. The pain relievers mentioned above will also help with this. Please follow-up with your primary provider giving frequent infections.   If you have been instructed to have an in-person evaluation today at a local Urgent Care facility, please use the link below. It will take you to a list of all of our available [facility], including address, phone number and hours of operation. Please do not delay care.  [facility]  If you or a family member do not have a primary care provider, use the link below to schedule a visit and establish care. When you choose a [facility] primary care physician or advanced practice provider, you gain a long-term partner in health. Find a Primary Care  Provider  Learn more about [facility]'s in-office and virtual care options: [facility] - Get Care Now
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
